# Patient Record
Sex: Female | Born: 1960 | Race: White | Hispanic: No | Marital: Single | Smoking: Never smoker
Health system: Southern US, Community
[De-identification: ages and names within clinical notes are randomized; demographics above are authoritative.]

---

## 2014-02-24 ENCOUNTER — Emergency Department (HOSPITAL_BASED_OUTPATIENT_CLINIC_OR_DEPARTMENT_OTHER): Payer: PRIVATE HEALTH INSURANCE

## 2014-02-24 ENCOUNTER — Encounter (HOSPITAL_BASED_OUTPATIENT_CLINIC_OR_DEPARTMENT_OTHER): Payer: Self-pay | Admitting: Emergency Medicine

## 2014-02-24 ENCOUNTER — Emergency Department (HOSPITAL_BASED_OUTPATIENT_CLINIC_OR_DEPARTMENT_OTHER)
Admission: EM | Admit: 2014-02-24 | Discharge: 2014-02-24 | Disposition: A | Discharge status: Home / Self Care | Payer: PRIVATE HEALTH INSURANCE | Attending: Emergency Medicine | Admitting: Emergency Medicine

## 2014-02-24 DIAGNOSIS — M25519 Pain in unspecified shoulder: Secondary | ICD-10-CM | POA: Diagnosis not present

## 2014-02-24 DIAGNOSIS — M25511 Pain in right shoulder: Secondary | ICD-10-CM

## 2014-02-24 DIAGNOSIS — R52 Pain, unspecified: Secondary | ICD-10-CM | POA: Diagnosis not present

## 2014-02-24 MED ORDER — IBUPROFEN 600 MG PO TABS: 600.0000 mg | ORAL_TABLET | Freq: Four times a day (QID) | ORAL | Status: DC | PRN

## 2014-02-24 MED ORDER — OXYCODONE-ACETAMINOPHEN 5-325 MG PO TABS
2.0000 | ORAL_TABLET | Freq: Once | ORAL | Status: DC
  Filled 2014-02-24: qty 2

## 2014-02-24 MED ORDER — OXYCODONE-ACETAMINOPHEN 5-325 MG PO TABS: 1.0000 | ORAL_TABLET | ORAL | Status: DC | PRN

## 2014-02-24 NOTE — ED Provider Notes (Signed)
CSN: [id number]     Arrival date & time 02/24/14  1526 History   First MD Initiated Contact with Patient 02/24/14 1713     Chief Complaint  Patient presents with  . Extremity Pain     (Consider location/radiation/quality/duration/timing/severity/associated sxs/prior Treatment) HPI 53 y.o. Female with injury to right shoulder that began two days ago when she caught a heavy falling object.  She has a history of impingement syndrome of the right shoulder several years ago and feels like it is similar.  She has taken ibuprofen without relief.  She is able to move it but does have pain with certain positions.  SHe denies numbness tingling or other injury.  History reviewed. No pertinent past medical history. History reviewed. No pertinent past surgical history. No family history on file. History  Substance Use Topics  . Smoking status: Never Smoker   . Smokeless tobacco: Not on file  . Alcohol Use: Not on file   OB History   Grav Para Term Preterm Abortions TAB SAB Ect Mult Living                 Review of Systems  All other systems reviewed and are negative.     Allergies  Review of patient's allergies indicates no known allergies.  Home Medications   Prior to Admission medications   Not on File   BP 140/72  Pulse 72  Temp(Src) 98.3 F (36.8 C)  Resp 18  Wt 172 lb (78.019 kg)  SpO2 99% Physical Exam  Nursing note and vitals reviewed. Constitutional: She appears well-developed and well-nourished.  HENT:  Head: Normocephalic and atraumatic.  Eyes: Conjunctivae and EOM are normal. Pupils are equal, round, and reactive to light.  Neck: Normal range of motion. Neck supple.  Cardiovascular: Normal rate, normal heart sounds and intact distal pulses.   Pulmonary/Chest: Effort normal and breath sounds normal.  Abdominal: Soft. Bowel sounds are normal.  Musculoskeletal:       Right shoulder: She exhibits decreased range of motion. She exhibits no tenderness, no bony  tenderness, no swelling, no effusion, no crepitus, no deformity, no laceration, no pain, no spasm, normal pulse and normal strength.  Pain and decreased external rotation, pain with abduction Elbow and wrist normal.     ED Course  Procedures (including critical care time) Labs Review Labs Reviewed - No data to display  Imaging Review Dg Shoulder Right  02/24/2014   CLINICAL DATA:  Right shoulder pain after lifting.  EXAM: RIGHT SHOULDER - 2+ VIEW  COMPARISON:  None.  FINDINGS: The humerus is located at the acromioclavicular joint is intact. There is no fracture. No notable degenerative change is present about the shoulder. Image right lung and ribs are unremarkable.  IMPRESSION: Negative exam.   Electronically Signed   By: Drusilla Kannerhomas  Dalessio M.D.   On: 02/24/2014 17:44     EKG Interpretation None      MDM   Final diagnoses:  Shoulder pain, acute, right    Discussed pain management and follow up with patient and she voices understanding.       Hilario Quarryanielle S Merilee Wible, MD 02/24/14 1800

## 2014-02-24 NOTE — ED Notes (Signed)
C/o rt arm pain and pick up large platter of lasagna and almost dropped it. Reached to grab it and felt pain in upper rt arm and shoulder. Onset Wednesday night. No other sx. States she also has a rash on rt forearm that started Sunday. Has had blisters but have ruptured. No itching.

## 2014-02-24 NOTE — Discharge Instructions (Signed)
Rotator Cuff Injury Rotator cuff injury is any type of injury to the set of muscles and tendons that make up the stabilizing unit of your shoulder. This unit holds the ball of your upper arm bone (humerus) in the socket of your shoulder blade (scapula).  CAUSES Injuries to your rotator cuff most commonly come from sports or activities that cause your arm to be moved repeatedly over your head. Examples of this include throwing, weight lifting, swimming, or racquet sports. Long lasting (chronic) irritation of your rotator cuff can cause soreness and swelling (inflammation), bursitis, and eventual damage to your tendons, such as a tear (rupture). SIGNS AND SYMPTOMS Acute rotator cuff tear:  Sudden tearing sensation followed by severe pain shooting from your upper shoulder down your arm toward your elbow.  Decreased range of motion of your shoulder because of pain and muscle spasm.  Severe pain.  Inability to raise your arm out to the side because of pain and loss of muscle power (large tears). Chronic rotator cuff tear:  Pain that usually is worse at night and may interfere with sleep.  Gradual weakness and decreased shoulder motion as the pain worsens.  Decreased range of motion. Rotator cuff tendinitis:  Deep ache in your shoulder and the outside upper arm over your shoulder.  Pain that comes on gradually and becomes worse when lifting your arm to the side or turning it inward. DIAGNOSIS Rotator cuff injury is diagnosed through a medical history, physical exam, and imaging exam. The medical history helps determine the type of rotator cuff injury. Your health care provider will look at your injured shoulder, feel the injured area, and ask you to move your shoulder in different positions. X-Dawn George exams typically are done to rule out other causes of shoulder pain, such as fractures. MRI is the exam of choice for the most severe shoulder injuries because the images show muscles and tendons.    TREATMENT  Chronic tear:  Medicine for pain, such as acetaminophen or ibuprofen.  Physical therapy and range-of-motion exercises may be helpful in maintaining shoulder function and strength.  Steroid injections into your shoulder joint.  Surgical repair of the rotator cuff if the injury does not heal with noninvasive treatment. Acute tear:  Anti-inflammatory medicines such as ibuprofen and naproxen to help reduce pain and swelling.  A sling to help support your arm and rest your rotator cuff muscles. Long-term use of a sling is not advised. It may cause significant stiffening of the shoulder joint.  Surgery may be considered within a few weeks, especially in younger, active people, to return the shoulder to full function.  Indications for surgical treatment include the following:  Age younger than 60 years.  Rotator cuff tears that are complete.  Physical therapy, rest, and anti-inflammatory medicines have been used for 6-8 weeks, with no improvement.  Employment or sporting activity that requires constant shoulder use. Tendinitis:  Anti-inflammatory medicines such as ibuprofen and naproxen to help reduce pain and swelling.  A sling to help support your arm and rest your rotator cuff muscles. Long-term use of a sling is not advised. It may cause significant stiffening of the shoulder joint.  Severe tendinitis may require:  Steroid injections into your shoulder joint.  Physical therapy.  Surgery. HOME CARE INSTRUCTIONS   Apply ice to your injury:  Put ice in a plastic bag.  Place a towel between your skin and the bag.  Leave the ice on for 20 minutes, 2-3 times a day.  If you   have a shoulder immobilizer (sling and straps), wear it until told otherwise by your health care provider.  You may want to sleep on several pillows or in a recliner at night to lessen swelling and pain.  Only take over-the-counter or prescription medicines for pain, discomfort, or fever as  directed by your health care provider.  Do simple hand squeezing exercises with a soft rubber ball to decrease hand swelling. SEEK MEDICAL CARE IF:   Your shoulder pain increases, or new pain or numbness develops in your arm, hand, or fingers.  Your hand or fingers are colder than your other hand. SEEK IMMEDIATE MEDICAL CARE IF:   Your arm, hand, or fingers are numb or tingling.  Your arm, hand, or fingers are increasingly swollen and painful, or they turn white or blue. MAKE SURE YOU:  Understand these instructions.  Will watch your condition.  Will get help right away if you are not doing well or get worse. Document Released: 06/27/2000 Document Revised: 07/05/2013 Document Reviewed: 02/09/2013 [employer] Patient Information 2015 [employer]. This information is not intended to replace advice given to you by your health care provider. Make sure you discuss any questions you have with your health care provider.  

## 2014-02-28 ENCOUNTER — Ambulatory Visit (INDEPENDENT_AMBULATORY_CARE_PROVIDER_SITE_OTHER): Payer: PRIVATE HEALTH INSURANCE | Admitting: Family Medicine

## 2014-02-28 ENCOUNTER — Encounter: Payer: Self-pay | Admitting: Family Medicine

## 2014-02-28 VITALS — BP 138/83 | HR 62 | Ht 62.0 in | Wt 172.0 lb

## 2014-02-28 DIAGNOSIS — M25519 Pain in unspecified shoulder: Secondary | ICD-10-CM

## 2014-02-28 DIAGNOSIS — M25511 Pain in right shoulder: Secondary | ICD-10-CM

## 2014-02-28 MED ORDER — DICLOFENAC SODIUM 75 MG PO TBEC: 75.0000 mg | DELAYED_RELEASE_TABLET | Freq: Two times a day (BID) | ORAL | Status: AC

## 2014-02-28 MED ORDER — HYDROCODONE-ACETAMINOPHEN 5-325 MG PO TABS: 1.0000 | ORAL_TABLET | Freq: Four times a day (QID) | ORAL | Status: AC | PRN

## 2014-02-28 MED ORDER — METAXALONE 800 MG PO TABS: 800.0000 mg | ORAL_TABLET | Freq: Three times a day (TID) | ORAL | Status: AC

## 2014-02-28 NOTE — Patient Instructions (Signed)
You have rotator cuff strain. Try to avoid painful activities (overhead activities, lifting with extended arm) as much as possible. Voltaren twice a day with food for pain and inflammation. During day can also take tylenol 500mg  1-2 tabs. Norco as needed for severe pain - this has tylenol in it. You don't want to take more than 8 tablets a day between the norco and the tylenol 500mg s. Skelaxin as needed for muscle spasms - if doesn't make you sleepy you can take this during the day too. Start physical therapy with transition to home exercise program. Do home exercise program with theraband and scapular stabilization exercises daily - these are very important for long term relief even if an injection was given. If not improving at follow-up we will consider further imaging. Follow up with me in 4-6 weeks.

## 2014-03-02 ENCOUNTER — Encounter: Payer: Self-pay | Admitting: Family Medicine

## 2014-03-02 DIAGNOSIS — M25511 Pain in right shoulder: Secondary | ICD-10-CM | POA: Insufficient documentation

## 2014-03-02 NOTE — Progress Notes (Signed)
Patient ID: Dawn BeaversNancy George, female   DOB: [birth year], 53 y.o.   MRN: [id number]  PCP: No PCP Per Patient  Subjective:   HPI: Patient is a 53 y.o. female here for right shoulder injury.  Patient reports 4 days ago she was getting a pan of lasagna out of refrigerator. Started to drop this, reached under the pain with right arm and caught it but started to have pain in right shoulder. Worse by the next day and currently pain is 6/10. Had full motion after this accident and still does but is painful. A year ago had impingement of this shoulder that improved with PT, injection, nsaids. Now is tough to sleep. Taking advil and percocet. Right handed.  History reviewed. No pertinent past medical history.  No current outpatient prescriptions on file prior to visit.   No current facility-administered medications on file prior to visit.    History reviewed. No pertinent past surgical history.  No Known Allergies  History   Social History  . Marital Status: Single    Spouse Name: N/A    Number of Children: N/A  . Years of Education: N/A   Occupational History  . Not on file.   Social History Main Topics  . Smoking status: Never Smoker   . Smokeless tobacco: Not on file  . Alcohol Use: Not on file  . Drug Use: Not on file  . Sexual Activity: Not on file   Other Topics Concern  . Not on file   Social History Narrative  . No narrative on file    No family history on file.  BP 138/83  Pulse 62  Ht 5\' 2"  (1.575 m)  Wt 172 lb (78.019 kg)  BMI 31.45 kg/m2  Review of Systems: See HPI above.    Objective:  Physical Exam:  Gen: NAD  Right shoulder: No swelling, ecchymoses.  No gross deformity. No TTP. FROM with painful arc. Positive Hawkins, Neers. Negative Speeds, Yergasons. Strength 5/5 with empty can and resisted internal/external rotation.  Pain with empty can only. Negative apprehension. NV intact distally.    Assessment & Plan:  1. Right shoulder pain -  2/2 supraspinatus strain.  Start with conservative treatment.  Voltaren with norco, skelaxin as needed.  Start physical therapy and home exercise program.  F/u in 4-6 weeks.  Would go ahead with imaging if not improving (MRI) - radiographs were negative.  Advised against injection with an acute injury because of risk of her having a partial thickness tear.

## 2014-03-02 NOTE — Assessment & Plan Note (Signed)
2/2 supraspinatus strain.  Start with conservative treatment.  Voltaren with norco, skelaxin as needed.  Start physical therapy and home exercise program.  F/u in 4-6 weeks.  Would go ahead with imaging if not improving (MRI) - radiographs were negative.  Advised against injection with an acute injury because of risk of her having a partial thickness tear.

## 2014-03-09 ENCOUNTER — Ambulatory Visit: Payer: PRIVATE HEALTH INSURANCE | Attending: Family Medicine | Admitting: Physical Therapy

## 2014-03-09 DIAGNOSIS — M25519 Pain in unspecified shoulder: Secondary | ICD-10-CM | POA: Insufficient documentation

## 2014-03-09 DIAGNOSIS — M25819 Other specified joint disorders, unspecified shoulder: Secondary | ICD-10-CM | POA: Insufficient documentation

## 2014-03-09 DIAGNOSIS — IMO0001 Reserved for inherently not codable concepts without codable children: Secondary | ICD-10-CM | POA: Diagnosis not present

## 2014-03-09 DIAGNOSIS — M7580 Other shoulder lesions, unspecified shoulder: Secondary | ICD-10-CM

## 2014-03-13 ENCOUNTER — Ambulatory Visit: Payer: PRIVATE HEALTH INSURANCE

## 2014-03-13 DIAGNOSIS — IMO0001 Reserved for inherently not codable concepts without codable children: Secondary | ICD-10-CM | POA: Diagnosis not present

## 2014-03-22 ENCOUNTER — Ambulatory Visit: Payer: PRIVATE HEALTH INSURANCE | Attending: Family Medicine | Admitting: Rehabilitation

## 2014-03-22 DIAGNOSIS — M25819 Other specified joint disorders, unspecified shoulder: Secondary | ICD-10-CM | POA: Diagnosis not present

## 2014-03-22 DIAGNOSIS — IMO0001 Reserved for inherently not codable concepts without codable children: Secondary | ICD-10-CM | POA: Diagnosis present

## 2014-03-22 DIAGNOSIS — M7580 Other shoulder lesions, unspecified shoulder: Secondary | ICD-10-CM

## 2014-03-22 DIAGNOSIS — M25519 Pain in unspecified shoulder: Secondary | ICD-10-CM | POA: Diagnosis not present

## 2014-03-24 ENCOUNTER — Ambulatory Visit: Payer: PRIVATE HEALTH INSURANCE | Admitting: Physical Therapy

## 2014-03-24 DIAGNOSIS — IMO0001 Reserved for inherently not codable concepts without codable children: Secondary | ICD-10-CM | POA: Diagnosis not present

## 2014-03-27 ENCOUNTER — Ambulatory Visit: Payer: PRIVATE HEALTH INSURANCE | Admitting: Rehabilitation

## 2014-03-27 ENCOUNTER — Encounter (INDEPENDENT_AMBULATORY_CARE_PROVIDER_SITE_OTHER): Payer: Self-pay

## 2014-03-27 DIAGNOSIS — IMO0001 Reserved for inherently not codable concepts without codable children: Secondary | ICD-10-CM | POA: Diagnosis not present

## 2014-03-29 ENCOUNTER — Ambulatory Visit: Payer: PRIVATE HEALTH INSURANCE | Admitting: Physical Therapy

## 2014-03-29 DIAGNOSIS — IMO0001 Reserved for inherently not codable concepts without codable children: Secondary | ICD-10-CM | POA: Diagnosis not present

## 2014-04-03 ENCOUNTER — Ambulatory Visit: Payer: PRIVATE HEALTH INSURANCE | Admitting: Rehabilitation

## 2014-04-03 DIAGNOSIS — IMO0001 Reserved for inherently not codable concepts without codable children: Secondary | ICD-10-CM | POA: Diagnosis not present

## 2014-04-04 ENCOUNTER — Encounter: Payer: Self-pay | Admitting: Family Medicine

## 2014-04-04 ENCOUNTER — Ambulatory Visit (INDEPENDENT_AMBULATORY_CARE_PROVIDER_SITE_OTHER): Payer: PRIVATE HEALTH INSURANCE | Admitting: Family Medicine

## 2014-04-04 VITALS — BP 116/79 | HR 71 | Ht 62.0 in | Wt 170.0 lb

## 2014-04-04 DIAGNOSIS — M25519 Pain in unspecified shoulder: Secondary | ICD-10-CM

## 2014-04-04 DIAGNOSIS — M25511 Pain in right shoulder: Secondary | ICD-10-CM

## 2014-04-05 ENCOUNTER — Encounter: Payer: Self-pay | Admitting: Family Medicine

## 2014-04-05 NOTE — Assessment & Plan Note (Signed)
2/2 supraspinatus strain.  Much improved.  Transition to HEP to do for an additional 6 weeks.  F/u prn.

## 2014-04-05 NOTE — Progress Notes (Signed)
Patient ID: Dawn George, female   DOB: [birth year], 53 y.o.   MRN: [id number]  PCP: No PCP Per Patient  Subjective:   HPI: Patient is a 53 y.o. female here for right shoulder injury.  8/18: Patient reports 4 days ago she was getting a pan of lasagna out of refrigerator. Started to drop this, reached under the pain with right arm and caught it but started to have pain in right shoulder. Worse by the next day and currently pain is 6/10. Had full motion after this accident and still does but is painful. A year ago had impingement of this shoulder that improved with PT, injection, nsaids. Now is tough to sleep. Taking advil and percocet. Right handed.  9/22: Patient reports she feels much improved, 80-95% better. Only feels pain if moving arm quickly to the side. No swelling. No new injuries. Doing physical therapy. No longer taking any medications. No night pain.  History reviewed. No pertinent past medical history.  Current Outpatient Prescriptions on File Prior to Visit  Medication Sig Dispense Refill  . diclofenac (VOLTAREN) 75 MG EC tablet Take 1 tablet (75 mg total) by mouth 2 (two) times daily.  60 tablet  1  . HYDROcodone-acetaminophen (NORCO) 5-325 MG per tablet Take 1 tablet by mouth every 6 (six) hours as needed for moderate pain.  40 tablet  0  . metaxalone (SKELAXIN) 800 MG tablet Take 1 tablet (800 mg total) by mouth 3 (three) times daily.  60 tablet  1   No current facility-administered medications on file prior to visit.    History reviewed. No pertinent past surgical history.  No Known Allergies  History   Social History  . Marital Status: Single    Spouse Name: N/A    Number of Children: N/A  . Years of Education: N/A   Occupational History  . Not on file.   Social History Main Topics  . Smoking status: Never Smoker   . Smokeless tobacco: Not on file  . Alcohol Use: Not on file  . Drug Use: Not on file  . Sexual Activity: Not on file   Other  Topics Concern  . Not on file   Social History Narrative  . No narrative on file    No family history on file.  BP 116/79  Pulse 71  Ht  (1.575 m)  Wt 170 lb (77.111 kg)  BMI 31.09 kg/m2  Review of Systems: See HPI above.    Objective:  Physical Exam:  Gen: NAD  Right shoulder: No swelling, ecchymoses.  No gross deformity. No TTP. FROM without painful arc. Negative Hawkins, Neers. Negative Speeds, Yergasons. Strength 5/5 with empty can and resisted internal/external rotation.  No pain empty can. Negative apprehension. NV intact distally.    Assessment & Plan:  1. Right shoulder pain - 2/2 supraspinatus strain.  Much improved.  Transition to HEP to do for an additional 6 weeks.  F/u prn.

## 2014-04-06 ENCOUNTER — Ambulatory Visit: Payer: PRIVATE HEALTH INSURANCE | Admitting: Physical Therapy

## 2014-04-10 ENCOUNTER — Ambulatory Visit: Payer: PRIVATE HEALTH INSURANCE | Admitting: Rehabilitation

## 2014-04-10 DIAGNOSIS — IMO0001 Reserved for inherently not codable concepts without codable children: Secondary | ICD-10-CM | POA: Diagnosis not present

## 2014-04-13 ENCOUNTER — Ambulatory Visit: Payer: PRIVATE HEALTH INSURANCE | Admitting: Rehabilitation

## 2014-04-13 ENCOUNTER — Encounter: Payer: PRIVATE HEALTH INSURANCE | Admitting: Rehabilitation

## 2014-04-17 ENCOUNTER — Ambulatory Visit: Payer: PRIVATE HEALTH INSURANCE | Admitting: Rehabilitation

## 2014-04-17 ENCOUNTER — Encounter: Payer: PRIVATE HEALTH INSURANCE | Admitting: Rehabilitation

## 2014-04-20 ENCOUNTER — Ambulatory Visit: Payer: PRIVATE HEALTH INSURANCE | Admitting: Physical Therapy

## 2014-04-20 ENCOUNTER — Encounter: Payer: PRIVATE HEALTH INSURANCE | Admitting: Physical Therapy

## 2016-03-01 IMAGING — CR DG SHOULDER 2+V*R*
3 series · 3 of 3 positions shown · non-contrast
Comparison: None.

CLINICAL DATA: Right shoulder pain after lifting.

EXAM:
RIGHT SHOULDER - 2+ VIEW

[w shoulder ap internal righ]
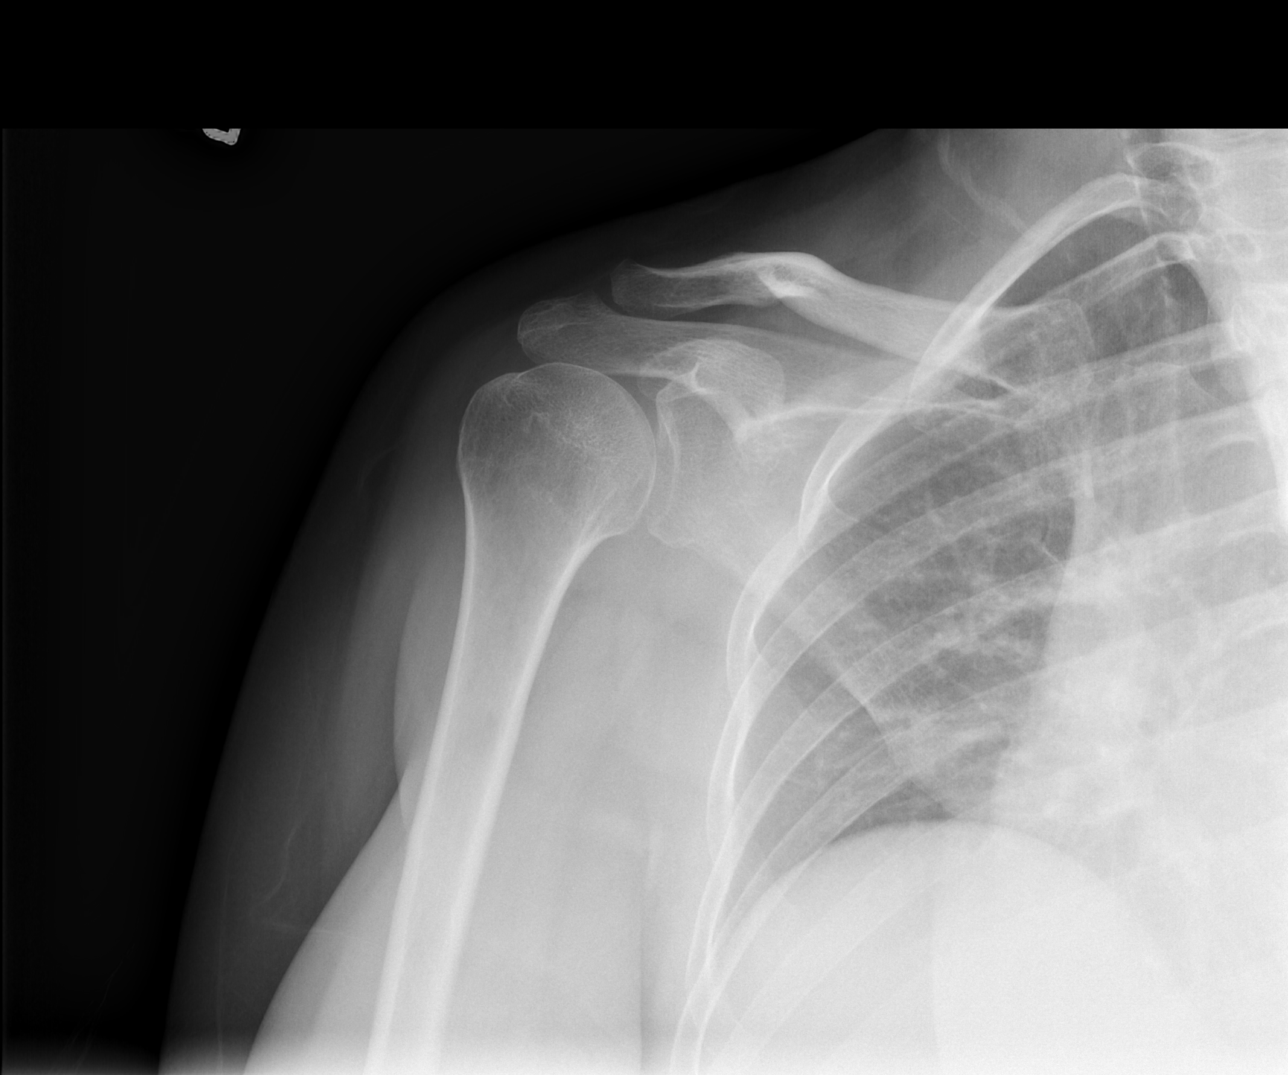

[w shoulder y view right]
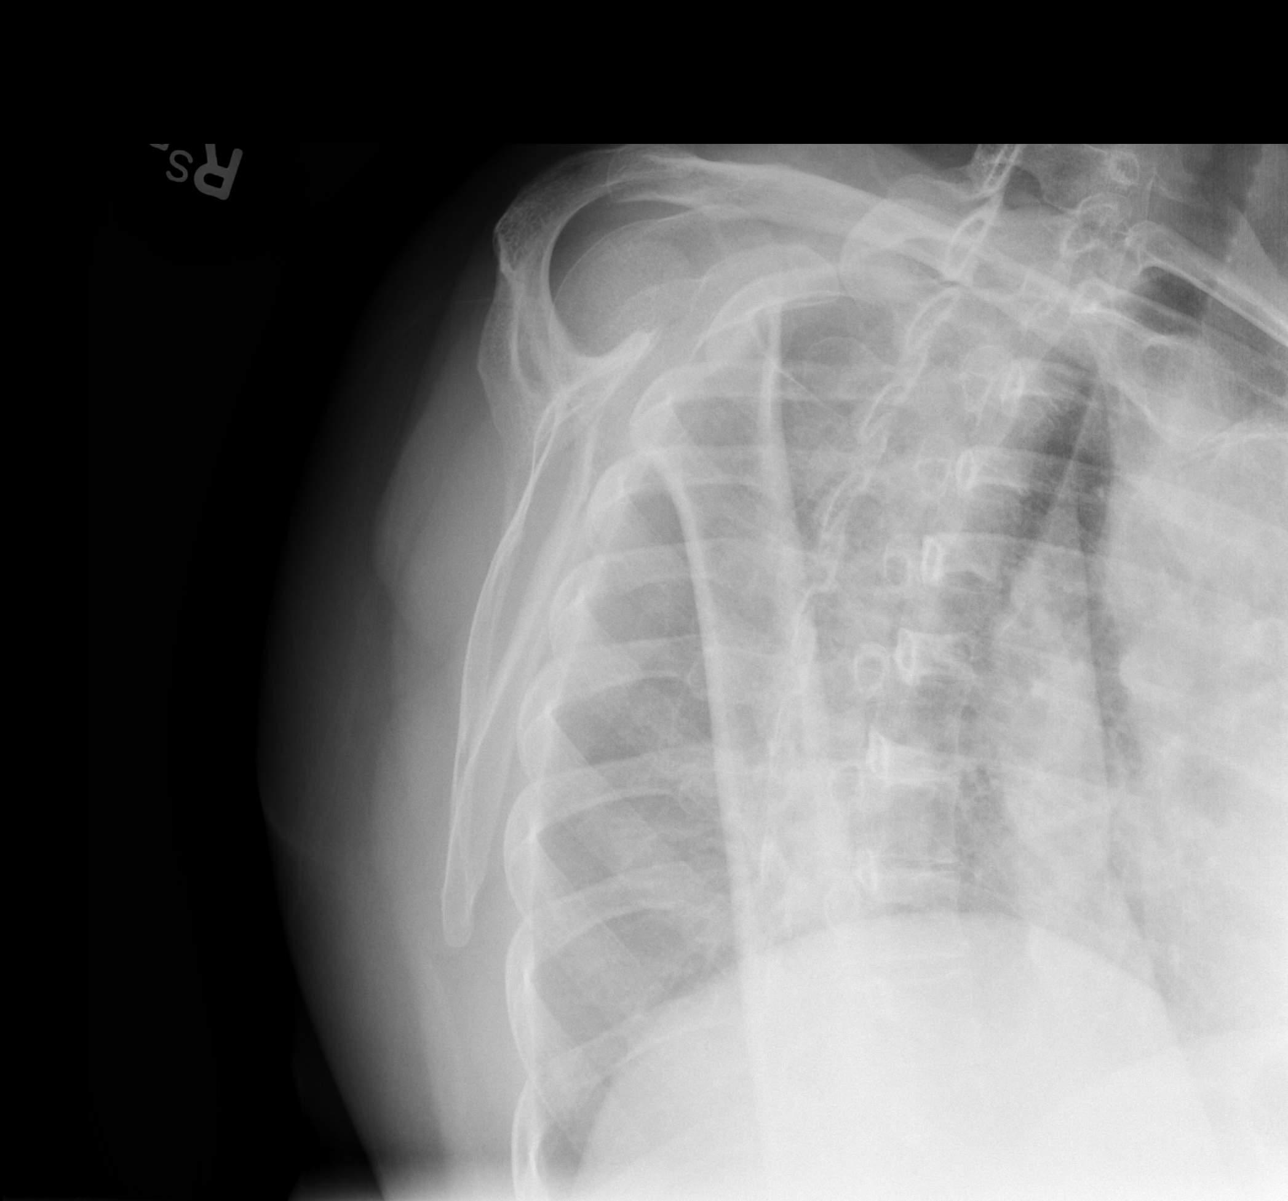

[x shoulder axillary right]
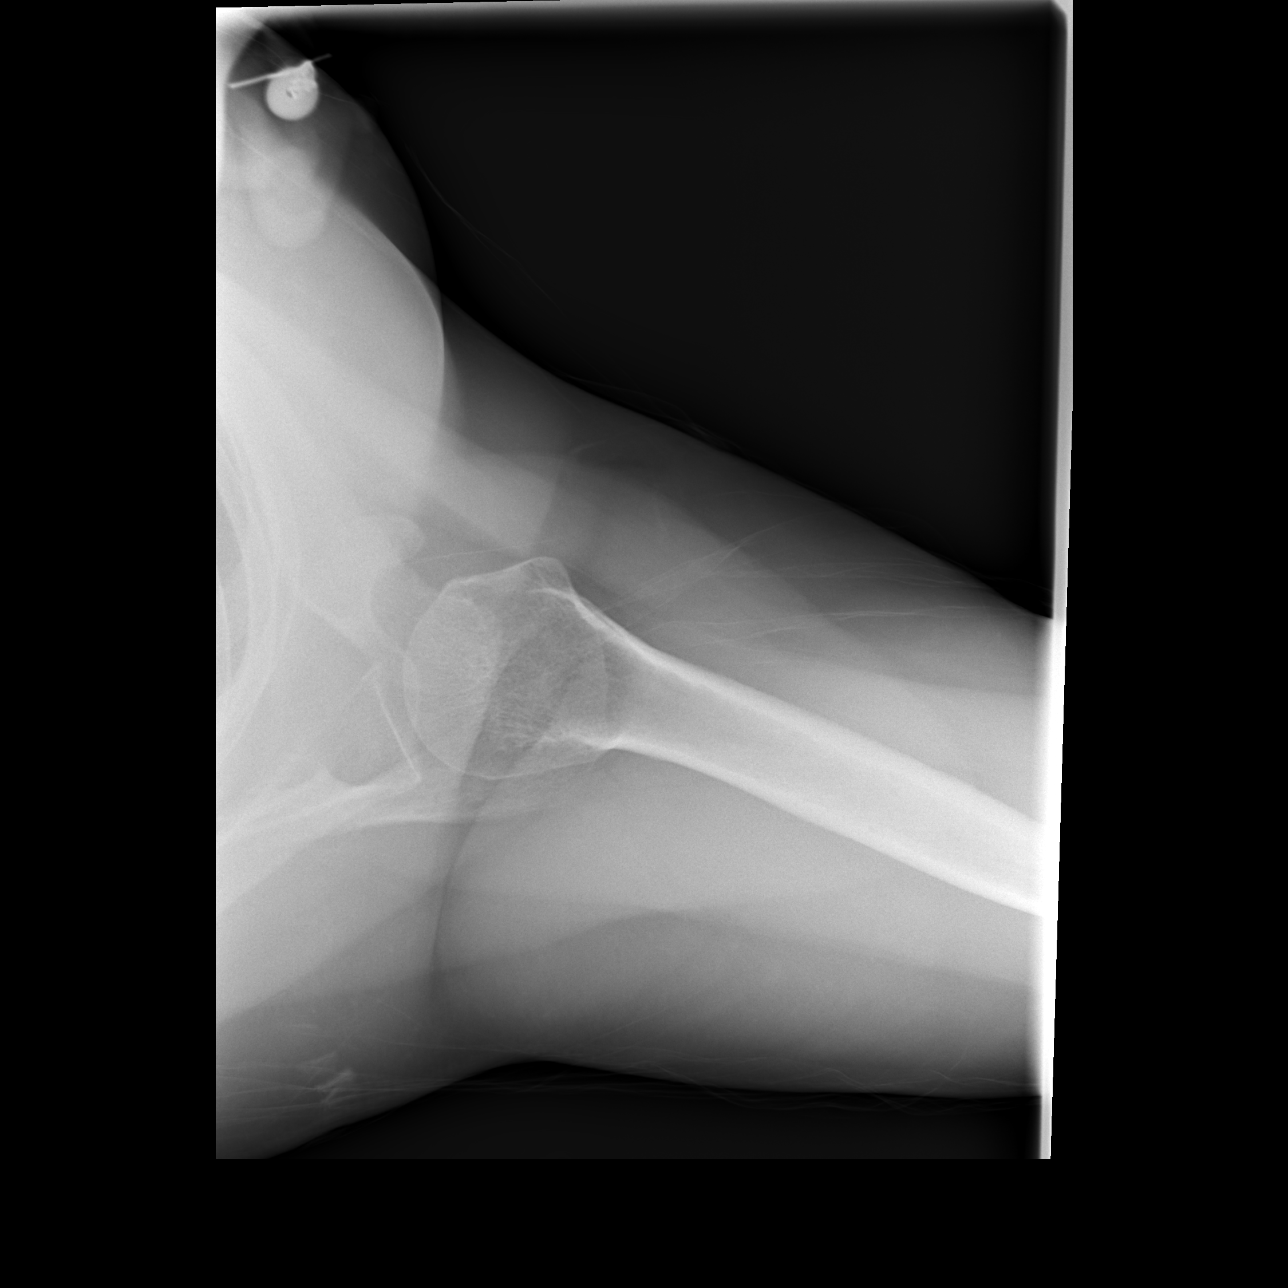

[3 of 3 positions shown; findings below may reference images not displayed]

FINDINGS: The humerus is located at the acromioclavicular joint is intact.
There is no fracture. No notable degenerative change is present
about the shoulder. Image right lung and ribs are unremarkable.
IMPRESSION: Negative exam.
